# Patient Record
Sex: Female | Born: 1975 | Hispanic: Yes | Smoking: Never smoker
Health system: Southern US, Community
[De-identification: ages and names within clinical notes are randomized; demographics above are authoritative.]

## PROBLEM LIST (undated history)

## (undated) DIAGNOSIS — K529 Noninfective gastroenteritis and colitis, unspecified: Secondary | ICD-10-CM

## (undated) DIAGNOSIS — K649 Unspecified hemorrhoids: Secondary | ICD-10-CM

## (undated) HISTORY — DX: Noninfective gastroenteritis and colitis, unspecified: K52.9

## (undated) HISTORY — PX: COLON SURGERY: SHX602

## (undated) HISTORY — PX: HEMORRHOID SURGERY: SHX153

## (undated) HISTORY — PX: APPENDECTOMY: SHX54

## (undated) HISTORY — DX: Unspecified hemorrhoids: K64.9

## (undated) HISTORY — PX: TOTAL ABDOMINAL HYSTERECTOMY W/ BILATERAL SALPINGOOPHORECTOMY: SHX83

---

## 2017-09-02 ENCOUNTER — Other Ambulatory Visit: Payer: Self-pay

## 2017-09-02 ENCOUNTER — Encounter: Payer: Self-pay | Admitting: Physician Assistant

## 2017-09-02 ENCOUNTER — Ambulatory Visit (INDEPENDENT_AMBULATORY_CARE_PROVIDER_SITE_OTHER): Payer: BLUE CROSS/BLUE SHIELD

## 2017-09-02 ENCOUNTER — Ambulatory Visit: Payer: BLUE CROSS/BLUE SHIELD | Admitting: Physician Assistant

## 2017-09-02 VITALS — BP 118/80 | HR 87 | Temp 99.0°F | Resp 18 | Ht 62.0 in | Wt 178.4 lb

## 2017-09-02 DIAGNOSIS — R109 Unspecified abdominal pain: Secondary | ICD-10-CM | POA: Diagnosis not present

## 2017-09-02 DIAGNOSIS — R1032 Left lower quadrant pain: Secondary | ICD-10-CM | POA: Diagnosis not present

## 2017-09-02 LAB — POCT URINALYSIS DIP (MANUAL ENTRY)
BILIRUBIN UA: NEGATIVE
BILIRUBIN UA: NEGATIVE mg/dL
Glucose, UA: NEGATIVE mg/dL
Leukocytes, UA: NEGATIVE
Nitrite, UA: NEGATIVE
PH UA: 7.5 (ref 5.0–8.0)
PROTEIN UA: NEGATIVE mg/dL
SPEC GRAV UA: 1.02 (ref 1.010–1.025)
Urobilinogen, UA: 0.2 E.U./dL

## 2017-09-02 LAB — POCT CBC
Granulocyte percent: 59 %G (ref 37–80)
HEMATOCRIT: 37.8 % (ref 37.7–47.9)
HEMOGLOBIN: 12.1 g/dL — AB (ref 12.2–16.2)
Lymph, poc: 3.2 (ref 0.6–3.4)
MCH: 24 pg — AB (ref 27–31.2)
MCHC: 32 g/dL (ref 31.8–35.4)
MCV: 75 fL — AB (ref 80–97)
MID (cbc): 0.7 (ref 0–0.9)
MPV: 8.7 fL (ref 0–99.8)
POC GRANULOCYTE: 5.6 (ref 2–6.9)
POC LYMPH PERCENT: 33.4 %L (ref 10–50)
POC MID %: 7.6 %M (ref 0–12)
Platelet Count, POC: 332 10*3/uL (ref 142–424)
RBC: 5.04 M/uL (ref 4.04–5.48)
RDW, POC: 18 %
WBC: 9.5 10*3/uL (ref 4.6–10.2)

## 2017-09-02 LAB — POCT URINE PREGNANCY: Preg Test, Ur: NEGATIVE

## 2017-09-02 LAB — GLUCOSE, POCT (MANUAL RESULT ENTRY): POC Glucose: 83 mg/dl (ref 70–99)

## 2017-09-02 NOTE — Progress Notes (Signed)
Patient ID: Kelsey Gaines, female     DOB: February 20, 1976, 42 y.o.    MRN: [id number]  PCP: Patient, No Pcp Per  Chief Complaint  Patient presents with  . Abdominal Pain    x1 day, per translator pt is having pain on the left side and goes below the belly button. Pt states she isn't having any urinary symptoms.     Subjective:   This patient is new to me and presents for evaluation of LEFT sided abdominal pain. She is accompanied by her boyfriend.  Pain was preceded by several days of constipation, for which she took MoM today and had results, with a decrease in her pain. Increased bowel gas. No melena or hematochezia. No urinary urgency, frequency or burning. No hematuria. No nausea, vomiting. No fever, chills.  History of intestinal infection/inflammation when she was living in the [place] a couple of years ago.   Review of Systems As above.  Prior to Admission medications   Not on File     Allergies  Allergen Reactions  . Advil [Ibuprofen] Hives  . Tylenol [Acetaminophen] Hives     There are no active problems to display for this patient.    Family History  Problem Relation Age of Onset  . Diabetes Mother      Social History   Socioeconomic History  . Marital status: Single    Spouse name: Not on file  . Number of children: 2  . Years of education: Not on file  . Highest education level: Some college, no degree  Social Needs  . Financial resource strain: Not on file  . Food insecurity - worry: Not on file  . Food insecurity - inability: Not on file  . Transportation needs - medical: Not on file  . Transportation needs - non-medical: Not on file  Occupational History  . Occupation: homemaker  Tobacco Use  . Smoking status: Never Smoker  . Smokeless tobacco: Never Used  Substance and Sexual Activity  . Alcohol use: No    Frequency: Never  . Drug use: No  . Sexual activity: Yes    Birth control/protection: None  Other Topics  Concern  . Not on file  Social History Narrative   Originally from the [place].   Left college after 3 semesters and cared for her mother until moving [place] the [place] in 04/2017.   She lives with her boyfriend and her older daughter.   Her younger daughter lives in the [place]         Objective:  Physical Exam  Constitutional: She is oriented to person, place, and time. She appears well-developed and well-nourished. She is active and cooperative. No distress.  BP 118/80 (BP Location: Right Arm, Patient Position: Sitting, Cuff Size: Large)   Pulse 87   Temp 99 F (37.2 C) (Oral)   Resp 18   Ht 5\' 2"  (1.575 m)   Wt 178 lb 6.4 oz (80.9 kg)   LMP 08/26/2017   SpO2 100%   BMI 32.63 kg/m   HENT:  Head: Normocephalic and atraumatic.  Right Ear: Hearing normal.  Left Ear: Hearing normal.  Eyes: Conjunctivae are normal. No scleral icterus.  Neck: Normal range of motion. Neck supple. No thyromegaly present.  Cardiovascular: Normal rate, regular rhythm and normal heart sounds.  Pulses:      Radial pulses are 2+ on the right side, and 2+ on the left side.  Pulmonary/Chest: Effort normal and breath sounds normal.  Abdominal: Soft.  Normal appearance. Bowel sounds are decreased. There is no hepatosplenomegaly. There is tenderness in the periumbilical area and left lower quadrant. There is no rigidity, no rebound, no guarding, no CVA tenderness, no tenderness at McBurney's point and negative Murphy's sign.  Lymphadenopathy:       Head (right side): No tonsillar, no preauricular, no posterior auricular and no occipital adenopathy present.       Head (left side): No tonsillar, no preauricular, no posterior auricular and no occipital adenopathy present.    She has no cervical adenopathy.       Right: No supraclavicular adenopathy present.       Left: No supraclavicular adenopathy present.  Neurological: She is alert and oriented to person, place, and time. No sensory deficit.  Skin: Skin is  warm, dry and intact. No rash noted. No cyanosis or erythema. Nails show no clubbing.  Psychiatric: She has a normal mood and affect. Her speech is normal and behavior is normal.     Results for orders placed or performed in visit on 09/02/17  POCT urinalysis dipstick  Result Value Ref Range   Color, UA yellow yellow   Clarity, UA cloudy (A) clear   Glucose, UA negative negative mg/dL   Bilirubin, UA negative negative   Ketones, POC UA negative negative mg/dL   Spec Grav, UA 1.610 9.604 - 1.025   Blood, UA trace-intact (A) negative   pH, UA 7.5 5.0 - 8.0   Protein Ur, POC negative negative mg/dL   Urobilinogen, UA 0.2 0.2 or 1.0 E.U./dL   Nitrite, UA Negative Negative   Leukocytes, UA Negative Negative  POCT glucose (manual entry)  Result Value Ref Range   POC Glucose 83 70 - 99 mg/dl  POCT urine pregnancy  Result Value Ref Range   Preg Test, Ur Negative Negative  POCT CBC  Result Value Ref Range   WBC 9.5 4.6 - 10.2 K/uL   Lymph, poc 3.2 0.6 - 3.4   POC LYMPH PERCENT 33.4 10 - 50 %L   MID (cbc) 0.7 0 - 0.9   POC MID % 7.6 0 - 12 %M   POC Granulocyte 5.6 2 - 6.9   Granulocyte percent 59.0 37 - 80 %G   RBC 5.04 4.04 - 5.48 M/uL   Hemoglobin 12.1 (A) 12.2 - 16.2 g/dL   HCT, POC 54.0 98.1 - 47.9 %   MCV 75.0 (A) 80 - 97 fL   MCH, POC 24.0 (A) 27 - 31.2 pg   MCHC 32.0 31.8 - 35.4 g/dL   RDW, POC 19.1 %   Platelet Count, POC 332 142 - 424 K/uL   MPV 8.7 0 - 99.8 fL   Dg Abd Acute W/chest  Result Date: 09/02/2017 CLINICAL DATA:  Left abdominal and flank pain. History of appendectomy. EXAM: DG ABDOMEN ACUTE W/ 1V CHEST COMPARISON:  None. FINDINGS: Cardiomediastinal silhouette is unremarkable. The lungs are clear. No pleural effusion, airspace disease or pneumothorax. Gas in scattered nondistended right small bowel loops noted. There is no evidence of bowel obstruction or pneumoperitoneum. A 5 mm calcification overlying the lower right abdomen is noted. The bony structures are  unremarkable. IMPRESSION: 1. 5 mm calcification overlying the lower right abdomen-probably of little clinical significance given history of appendectomy. 2. Nonspecific nonobstructive bowel gas pattern. No evidence of pneumoperitoneum or suspicious calcifications. 3. No evidence of active cardiopulmonary disease. Electronically Signed   By: Harmon Pier M.D.   On: 09/02/2017 15:13       Assessment &  Plan:  1. Left lower quadrant pain Suspect constipation. No systemic symptoms. Normal CBC is reassuring. Supportive care. RTC if worsening or persists. Consider CT scan to evaluate for diverticulitis, nephrolithiasis. - POCT urinalysis dipstick - Urine Culture - Urine Microscopic - POCT glucose (manual entry) - POCT urine pregnancy - GC/Chlamydia Probe Amp - POCT CBC - DG Abd Acute W/Chest; Future    Return if symptoms worsen or fail to improve in the next 3-5 days.   Fernande Bras, PA-C Primary Care at [facility]

## 2017-09-02 NOTE — Progress Notes (Signed)
Subjective:    Patient ID: Kelsey Gaines, female    DOB: 08/10/1975, 42 y.o.   MRN: [id number]  Chief Complaint  Patient presents with  . Abdominal Pain    x1 day, per translator pt is having pain on the left side and goes below the belly button. Pt states she isn't having any urinary symptoms.    Presents today with left-sided abdominal pain which started yesterday night around 6 pm. The pain came on suddenly. She was not eating anything when the pain occurred. She has been constipated for the past 2 days. Took Milk of Magnesium this morning and had a bowel movement. Denies any blood in her stool. Notes increased flatulence and a "warm heat" sensation around her stomach. Denies any symptoms prior to the pain yesterday evening.  Denies: nausea, vomiting, diarrhea, fever, chills, increased urination, burning with urination, or hematuria. She is currently sexually active with her boyfriend who accompanies her today. No current methods to prevent pregnancy or STIs.   Previously in the [place] she had an infection in her intestines. She does not recall what condition she had or what treatment was provided, but notes inflammation in the lining of her intestines. A camera was used to make the diagnosis.  There is a language barrier present. Video translator in the appointment. Little information was elicited from the patient. Responded to most answers with "more or less."  Review of Systems As above  There are no active problems to display for this patient.  Prior to Admission medications   Not on File   Allergies  Allergen Reactions  . Advil [Ibuprofen] Hives  . Diclofenac   . Tylenol [Acetaminophen] Hives      Objective:   Physical Exam  Constitutional: She is oriented to person, place, and time. She appears well-developed and well-nourished. No distress.  HENT:  Head: Normocephalic.  Eyes: No scleral icterus.  Neck: No JVD present. No thyromegaly present.    Cardiovascular: Normal rate, regular rhythm, normal heart sounds and intact distal pulses. Exam reveals no gallop and no friction rub.  No murmur heard. Pulmonary/Chest: Effort normal and breath sounds normal. No respiratory distress. She has no wheezes. She has no rales. She exhibits no tenderness.  Abdominal: Soft. Normal appearance and bowel sounds are normal. She exhibits no shifting dullness, no distension, no fluid wave, no ascites and no mass. There is tenderness in the suprapubic area and left lower quadrant. There is no rigidity, no rebound, no guarding, no CVA tenderness, no tenderness at McBurney's point and negative Murphy's sign. No hernia.  Lymphadenopathy:    She has no cervical adenopathy.  Neurological: She is alert and oriented to person, place, and time.   Results for orders placed or performed in visit on 09/02/17 (from the past 24 hour(s))  POCT urinalysis dipstick     Status: Abnormal   Collection Time: 09/02/17  1:42 PM  Result Value Ref Range   Color, UA yellow yellow   Clarity, UA cloudy (A) clear   Glucose, UA negative negative mg/dL   Bilirubin, UA negative negative   Ketones, POC UA negative negative mg/dL   Spec Grav, UA 8.2951.020 6.2131.010 - 1.025   Blood, UA trace-intact (A) negative   pH, UA 7.5 5.0 - 8.0   Protein Ur, POC negative negative mg/dL   Urobilinogen, UA 0.2 0.2 or 1.0 E.U./dL   Nitrite, UA Negative Negative   Leukocytes, UA Negative Negative  POCT urine pregnancy     Status: None  Collection Time: 09/02/17  2:39 PM  Result Value Ref Range   Preg Test, Ur Negative Negative  POCT glucose (manual entry)     Status: None   Collection Time: 09/02/17  2:58 PM  Result Value Ref Range   POC Glucose 83 70 - 99 mg/dl  POCT CBC     Status: Abnormal   Collection Time: 09/02/17  3:00 PM  Result Value Ref Range   WBC 9.5 4.6 - 10.2 K/uL   Lymph, poc 3.2 0.6 - 3.4   POC LYMPH PERCENT 33.4 10 - 50 %L   MID (cbc) 0.7 0 - 0.9   POC MID % 7.6 0 - 12 %M    POC Granulocyte 5.6 2 - 6.9   Granulocyte percent 59.0 37 - 80 %G   RBC 5.04 4.04 - 5.48 M/uL   Hemoglobin 12.1 (A) 12.2 - 16.2 g/dL   HCT, POC 96.0 45.4 - 47.9 %   MCV 75.0 (A) 80 - 97 fL   MCH, POC 24.0 (A) 27 - 31.2 pg   MCHC 32.0 31.8 - 35.4 g/dL   RDW, POC 09.8 %   Platelet Count, POC 332 142 - 424 K/uL   MPV 8.7 0 - 99.8 fL   Chest X-ray results 09/02/17 IMPRESSION: 1. 5 mm calcification overlying the lower right abdomen-probably of little clinical significance given history of appendectomy. 2. Nonspecific nonobstructive bowel gas pattern. No evidence of pneumoperitoneum or suspicious calcifications. 3. No evidence of active cardiopulmonary disease.    Assessment & Plan:  1. Left lower quadrant pain Unclear etiology. POCT urinalysis, glucose, and CBC within normal limits. CXray shows no signs of bowel obstruction or kidney stones. Likely due to recent constipation, since pain improved after bowel movement this morning. Educated on ways to promote bowel health. Instructed to increase daily intake of water and fiber. Increase physical activity. If needed, use stool softener (Docusate) or an osmotic laxative (Miralax). Await results of urine culture and GC/Chlamydia probe. Will adjust treatment as indicated by results.   - POCT urinalysis dipstick - Urine Culture - Urine Microscopic - POCT glucose (manual entry) - POCT urine pregnancy - GC/Chlamydia Probe Amp - POCT CBC - DG Abd Acute W/Chest; Future  Return if symptoms worsen or fail to improve in the next 3-5 days.  Alfonse Alpers, PA-S

## 2017-09-02 NOTE — Patient Instructions (Addendum)
So far, there is no cause for your symptoms. I suspect that the pain is due to having recent constipation, and will resolve with supportive care.  To help reduce constipation and promote bowel health, 1. Drink at least 64 ounces of water each day; 2. Eat plenty of fiber (fruits, vegetables, whole grains, legumes) 3. Get plenty of physical activity  If needed, use a stool softener (docusate) or an osmotic laxative (like Miralax) each day, or as needed.   IF you received an x-ray today, you will receive an invoice from [facility]. Please contact [facility] at [phone] with questions or concerns regarding your invoice.   IF you received labwork today, you will receive an invoice from [facility]. Please contact [facility] at [phone] with questions or concerns regarding your invoice.   Our billing staff will not be able to assist you with questions regarding bills from these companies.  You will be contacted with the lab results as soon as they are available. The fastest way to get your results is to activate your My Chart account. Instructions are located on the last page of this paperwork. If you have not heard from us regarding the results in 2 weeks, please contact this office.

## 2017-09-03 LAB — URINALYSIS, MICROSCOPIC ONLY: Casts: NONE SEEN /lpf

## 2017-09-03 LAB — URINE CULTURE: ORGANISM ID, BACTERIA: NO GROWTH

## 2017-09-03 LAB — GC/CHLAMYDIA PROBE AMP
CHLAMYDIA, DNA PROBE: NEGATIVE
Neisseria gonorrhoeae by PCR: NEGATIVE

## 2017-09-09 ENCOUNTER — Telehealth: Payer: Self-pay | Admitting: Physician Assistant

## 2017-09-09 NOTE — Telephone Encounter (Signed)
Patient called with interpreter on the line- patient notified of results. She states she is still having the pain. She was offered follow up- she will call back for appointment.

## 2017-09-13 ENCOUNTER — Ambulatory Visit (INDEPENDENT_AMBULATORY_CARE_PROVIDER_SITE_OTHER): Payer: BLUE CROSS/BLUE SHIELD | Admitting: Physician Assistant

## 2017-09-13 ENCOUNTER — Other Ambulatory Visit: Payer: Self-pay

## 2017-09-13 ENCOUNTER — Encounter: Payer: Self-pay | Admitting: Physician Assistant

## 2017-09-13 VITALS — BP 110/78 | HR 67 | Temp 98.9°F | Ht 61.5 in | Wt 180.0 lb

## 2017-09-13 DIAGNOSIS — R1084 Generalized abdominal pain: Secondary | ICD-10-CM

## 2017-09-13 DIAGNOSIS — R109 Unspecified abdominal pain: Secondary | ICD-10-CM | POA: Diagnosis not present

## 2017-09-13 DIAGNOSIS — K5900 Constipation, unspecified: Secondary | ICD-10-CM

## 2017-09-13 LAB — POCT URINALYSIS DIP (MANUAL ENTRY)
Bilirubin, UA: NEGATIVE
Blood, UA: NEGATIVE
Glucose, UA: NEGATIVE mg/dL
Ketones, POC UA: NEGATIVE mg/dL
NITRITE UA: NEGATIVE
PROTEIN UA: NEGATIVE mg/dL
Spec Grav, UA: 1.02 (ref 1.010–1.025)
UROBILINOGEN UA: 0.2 U/dL
pH, UA: 5.5 (ref 5.0–8.0)

## 2017-09-13 MED ORDER — SIMETHICONE 80 MG PO TABS: 1.0000 | ORAL_TABLET | Freq: Four times a day (QID) | ORAL | 0 refills | Status: AC

## 2017-09-13 NOTE — Patient Instructions (Addendum)
Take a probiotic supplement daily.  You can get this from any food store.   I would also like you to try the gas medicine. For the next 5 days, take miralax twice per day.    What foods are not recommended? The following are some foods and drinks that may worsen your symptoms:  Fatty foods, such as [place] fries.  Milk products, such as cheese or ice cream.  Chocolate.  Alcohol.  Products with caffeine, such as coffee.  Carbonated drinks, such as soda.  The items listed above may not be a complete list of foods and beverages to avoid. Contact your dietitian for more information. What foods are good sources of fiber? Your health care provider or dietitian may recommend that you eat more foods that contain fiber. Fiber can help reduce constipation and other IBS symptoms. Add foods with fiber to your diet a little at a time so that your body can get used to them. Too much fiber at once might cause gas and swelling of your abdomen. The following are some foods that are good sources of fiber:  Apples.  Peaches.  Pears.  Berries.  Figs.  Broccoli (raw).  Cabbage.  Carrots.  Raw peas.  Kidney beans.  Lima beans.  Whole grain bread.  Whole grain cereal.  Where to find more information: [employer] for Functional Gastrointestinal Disorders: www.iffgd.[employer] National Institute of Diabetes and Digestive and Kidney Diseases: http://norris-lawson.com/www.niddk.nih.gov/health-information/health-topics/digestive-diseases/ibs/Pages/facts.aspx This information is not intended to replace advice given to you by your health care provider. Make sure you discuss any questions you have with your health care provider. Document Released: 10/05/2003 Document Revised: 12/21/2015 Document Reviewed: 10/15/2013 Elsevier Interactive Patient Education  2018 [employer]    IF you received an x-ray today, you will receive an invoice from [facility]. Please contact [facility] at  [phone] with questions or concerns regarding your invoice.   IF you received labwork today, you will receive an invoice from [facility]. Please contact [facility] at [phone] with questions or concerns regarding your invoice.   Our billing staff will not be able to assist you with questions regarding bills from these companies.  You will be contacted with the lab results as soon as they are available. The fastest way to get your results is to activate your My Chart account. Instructions are located on the last page of this paperwork. If you have not heard from us regarding the results in 2 weeks, please contact this office.

## 2017-09-13 NOTE — Progress Notes (Signed)
PRIMARY CARE AT [facility] [address], [place] [address] [phone]  Date:  09/13/2017   Name:  Kelsey Gaines   DOB:  02-02-1976   MRN:  [id number]  PCP:  Patient, No Pcp Per    History of Present Illness:  Kelsey Gaines is a 42 y.o. female patient who presents to PCP with  Chief Complaint  Patient presents with  . pain abdomen    Left sided pain at waistline - constant, pt increased water, increased fiber, walking  . Nausea    today  . Headache    yesterday   . Depression    42 yo dtr still in [place]- w/grandparents     The pain will get better now, but the pain comes and goes.  This is painful when she pushes on the left side of her abdomen.  This can occur when she is not pressing on it.  She does have less pain after she has a bowel movement.  She feels gassy.   She has been eating fruits vegetables, and broccoli.    She has had this pain before.  This was dxd as constipation prior.  Her bowel movements are not related to stressors that she has noted.   She has had stressors.  When asked about her stressors, she and daughter explain that her daughter is at her country.  She is laughing.      There are no active problems to display for this patient.   History reviewed. No pertinent past medical history.  Past Surgical History:  Procedure Laterality Date  . APPENDECTOMY    . CESAREAN SECTION    . COLON SURGERY      Social History   Tobacco Use  . Smoking status: Never Smoker  . Smokeless tobacco: Never Used  Substance Use Topics  . Alcohol use: No    Frequency: Never  . Drug use: No    Family History  Problem Relation Age of Onset  . Diabetes Mother     Allergies  Allergen Reactions  . Ciprofloxacin Hives  . Advil [Ibuprofen] Hives  . Diclofenac   . Tylenol [Acetaminophen] Hives    Medication list has been reviewed and updated.  No current outpatient medications on file prior to visit.   No current facility-administered medications on file  prior to visit.     ROS ROS otherwise unremarkable unless listed above.  Physical Examination: BP 110/78 (BP Location: Right Arm, Patient Position: Sitting, Cuff Size: Large)   Pulse 67   Temp 98.9 F (37.2 C) (Oral)   Ht 5' 1.5" (1.562 m)   Wt 180 lb (81.6 kg)   LMP 08/26/2017   SpO2 99%   BMI 33.46 kg/m  Ideal Body Weight: Weight in (lb) to have BMI = 25: 134.2  Physical Exam  Constitutional: She is oriented to person, place, and time. She appears well-developed and well-nourished. No distress.  HENT:  Head: Normocephalic and atraumatic.  Right Ear: External ear normal.  Left Ear: External ear normal.  Eyes: Conjunctivae and EOM are normal. Pupils are equal, round, and reactive to light.  Cardiovascular: Normal rate.  Pulmonary/Chest: Effort normal. No respiratory distress.  Abdominal: Soft. Normal appearance and bowel sounds are normal. There is no hepatosplenomegaly. There is no tenderness (minimally tenderness generalized but nothing specific or consistent.).  Neurological: She is alert and oriented to person, place, and time.  Skin: She is not diaphoretic.  Psychiatric: She has a normal mood and affect. Her behavior is normal.  Assessment and Plan: Kelsey Gaines is a 42 y.o. female who is here today for cc of  Chief Complaint  Patient presents with  . pain abdomen    Left sided pain at waistline - constant, pt increased water, increased fiber, walking  . Nausea    today  . Headache    yesterday   . Depression    42 yo dtr still in [place]- w/grandparents  declines treatment at this time for depression, and this is very situational.   Given simethicone for gas symptoms.   Discussed diary monitoring.   Obtaining CT, however patient states that they may continue to try treatment, and attempt to resolve at home.  Concerned of cost.   Possible IBS  Left sided abdominal pain - Plan: POCT urinalysis dipstick, CMP14+EGFR, TSH, CBC, Simethicone 80 MG TABS, CANCELED:  Celiac panel 10  Right sided abdominal pain - Plan: CBC, Simethicone 80 MG TABS, CANCELED: Celiac panel 10  Constipation, unspecified constipation type - Plan: POCT urinalysis dipstick, CMP14+EGFR, TSH, CBC, Simethicone 80 MG TABS, CANCELED: Celiac panel 10  Generalized abdominal pain - Plan: CT ABDOMEN PELVIS W CONTRAST, CBC, Simethicone 80 MG TABS  Ivar Drape, PA-C [facility] 2/26/20192:04 PM

## 2017-09-14 LAB — CBC
HEMATOCRIT: 40 % (ref 34.0–46.6)
Hemoglobin: 12.1 g/dL (ref 11.1–15.9)
MCH: 23.7 pg — ABNORMAL LOW (ref 26.6–33.0)
MCHC: 30.3 g/dL — AB (ref 31.5–35.7)
MCV: 78 fL — AB (ref 79–97)
Platelets: 319 10*3/uL (ref 150–379)
RBC: 5.1 x10E6/uL (ref 3.77–5.28)
RDW: 17.4 % — ABNORMAL HIGH (ref 12.3–15.4)
WBC: 10.5 10*3/uL (ref 3.4–10.8)

## 2017-09-14 LAB — CMP14+EGFR
ALT: 17 [IU]/L (ref 0–32)
AST: 18 [IU]/L (ref 0–40)
Albumin/Globulin Ratio: 1.4 (ref 1.2–2.2)
Albumin: 4.1 g/dL (ref 3.5–5.5)
Alkaline Phosphatase: 88 [IU]/L (ref 39–117)
BUN/Creatinine Ratio: 15 (ref 9–23)
BUN: 12 mg/dL (ref 6–24)
Bilirubin Total: <0.2 mg/dL (ref 0.0–1.2)
CO2: 18 mmol/L — ABNORMAL LOW (ref 20–29)
Calcium: 9.8 mg/dL (ref 8.7–10.2)
Chloride: 104 mmol/L (ref 96–106)
Creatinine, Ser: 0.78 mg/dL (ref 0.57–1.00)
GFR calc Af Amer: 108 mL/min/{1.73_m2}
GFR calc non Af Amer: 93 mL/min/{1.73_m2}
Globulin, Total: 2.9 g/dL (ref 1.5–4.5)
Glucose: 78 mg/dL (ref 65–99)
Potassium: 4.7 mmol/L (ref 3.5–5.2)
Sodium: 137 mmol/L (ref 134–144)
Total Protein: 7 g/dL (ref 6.0–8.5)

## 2017-09-14 LAB — TSH: TSH: 1.38 u[IU]/mL (ref 0.450–4.500)

## 2017-09-23 ENCOUNTER — Telehealth: Payer: Self-pay | Admitting: Physician Assistant

## 2017-09-23 NOTE — Telephone Encounter (Signed)
Tried to get prior auth for pt to have ct done but it was denied through pt insurance. A peer to peer can be done by calling [employer] at [phone] pt bcbs id# [id number] and dob Jul 26, 1976

## 2017-09-23 NOTE — Telephone Encounter (Signed)
Patient was very reluctant to have this performed.  Can you contact patient, and inquire if she would like to proceed or hold off with ct

## 2017-09-24 NOTE — Telephone Encounter (Signed)
Talked to pt husband and daughter to translate and pt would still like the ct done. The peer to peer still can be done to see if it can get approved through insurance

## 2017-10-28 ENCOUNTER — Encounter: Payer: Self-pay | Admitting: Physician Assistant

## 2018-08-20 ENCOUNTER — Other Ambulatory Visit: Payer: Self-pay

## 2018-08-20 ENCOUNTER — Emergency Department (HOSPITAL_COMMUNITY): Payer: Self-pay

## 2018-08-20 ENCOUNTER — Emergency Department (HOSPITAL_COMMUNITY)
Admission: EM | Admit: 2018-08-20 | Discharge: 2018-08-20 | Disposition: A | Discharge status: Home / Self Care | Payer: Self-pay | Attending: Emergency Medicine | Admitting: Emergency Medicine

## 2018-08-20 ENCOUNTER — Encounter (HOSPITAL_COMMUNITY): Payer: Self-pay

## 2018-08-20 DIAGNOSIS — R1032 Left lower quadrant pain: Secondary | ICD-10-CM | POA: Insufficient documentation

## 2018-08-20 DIAGNOSIS — Z79899 Other long term (current) drug therapy: Secondary | ICD-10-CM | POA: Insufficient documentation

## 2018-08-20 DIAGNOSIS — N83202 Unspecified ovarian cyst, left side: Secondary | ICD-10-CM | POA: Insufficient documentation

## 2018-08-20 DIAGNOSIS — R1030 Lower abdominal pain, unspecified: Secondary | ICD-10-CM

## 2018-08-20 LAB — CBC WITH DIFFERENTIAL/PLATELET
Abs Immature Granulocytes: 0.05 10*3/uL (ref 0.00–0.07)
Basophils Absolute: 0.1 10*3/uL (ref 0.0–0.1)
Basophils Relative: 1 %
Eosinophils Absolute: 0.4 10*3/uL (ref 0.0–0.5)
Eosinophils Relative: 3 %
HCT: 39.2 % (ref 36.0–46.0)
Hemoglobin: 11.5 g/dL — ABNORMAL LOW (ref 12.0–15.0)
Immature Granulocytes: 0 %
Lymphocytes Relative: 24 %
Lymphs Abs: 3.1 10*3/uL (ref 0.7–4.0)
MCH: 21.9 pg — ABNORMAL LOW (ref 26.0–34.0)
MCHC: 29.3 g/dL — ABNORMAL LOW (ref 30.0–36.0)
MCV: 74.8 fL — ABNORMAL LOW (ref 80.0–100.0)
Monocytes Absolute: 0.8 10*3/uL (ref 0.1–1.0)
Monocytes Relative: 6 %
Neutro Abs: 8.6 10*3/uL — ABNORMAL HIGH (ref 1.7–7.7)
Neutrophils Relative %: 66 %
Platelets: 374 10*3/uL (ref 150–400)
RBC: 5.24 MIL/uL — ABNORMAL HIGH (ref 3.87–5.11)
RDW: 16.9 % — ABNORMAL HIGH (ref 11.5–15.5)
WBC: 12.9 10*3/uL — ABNORMAL HIGH (ref 4.0–10.5)
nRBC: 0 % (ref 0.0–0.2)

## 2018-08-20 LAB — URINALYSIS, ROUTINE W REFLEX MICROSCOPIC
Bilirubin Urine: NEGATIVE
Glucose, UA: NEGATIVE mg/dL
Hgb urine dipstick: NEGATIVE
Ketones, ur: NEGATIVE mg/dL
Leukocytes, UA: NEGATIVE
Nitrite: NEGATIVE
Protein, ur: NEGATIVE mg/dL
Specific Gravity, Urine: 1.01 (ref 1.005–1.030)
pH: 6 (ref 5.0–8.0)

## 2018-08-20 LAB — LIPASE, BLOOD: Lipase: 26 U/L (ref 11–51)

## 2018-08-20 LAB — COMPREHENSIVE METABOLIC PANEL
ALT: 18 U/L (ref 0–44)
AST: 25 U/L (ref 15–41)
Albumin: 3.9 g/dL (ref 3.5–5.0)
Alkaline Phosphatase: 81 U/L (ref 38–126)
Anion gap: 9 (ref 5–15)
BUN: 10 mg/dL (ref 6–20)
CO2: 20 mmol/L — ABNORMAL LOW (ref 22–32)
Calcium: 9.1 mg/dL (ref 8.9–10.3)
Chloride: 105 mmol/L (ref 98–111)
Creatinine, Ser: 0.79 mg/dL (ref 0.44–1.00)
GFR calc Af Amer: >60 mL/min (ref >60)
GFR calc non Af Amer: >60 mL/min (ref >60)
Glucose, Bld: 90 mg/dL (ref 70–99)
Potassium: 4.4 mmol/L (ref 3.5–5.1)
Sodium: 134 mmol/L — ABNORMAL LOW (ref 135–145)
Total Bilirubin: 0.6 mg/dL (ref 0.3–1.2)
Total Protein: 7.3 g/dL (ref 6.5–8.1)

## 2018-08-20 LAB — POC URINE PREG, ED: Preg Test, Ur: NEGATIVE

## 2018-08-20 MED ORDER — IOPAMIDOL (ISOVUE-300) INJECTION 61%
100.0000 mL | Freq: Once | INTRAVENOUS | Status: AC | PRN
  Administered 2018-08-20: 100 mL via INTRAVENOUS

## 2018-08-20 NOTE — ED Triage Notes (Signed)
Patient arrives to ED with complaints of lower abdominal pain since last night; this problem has been on and off but is worse at night. Patient has been taking aleve, tylenol, and ibuprofen at home for pain relief, pt denies vomiting or fever, denies vaginal discharge or bleeding, or urinary symptoms. Translator utilized for this triage; patient is primarily spanish speaking.

## 2018-08-20 NOTE — ED Notes (Signed)
Per lab, blood samples clotted. Will reorder.  IV team was unsuccessful initiating an IV; second IV team RN will attempt.

## 2018-08-20 NOTE — Discharge Instructions (Addendum)
Please read attached information. If you experience any new or worsening signs or symptoms please return to the emergency room for evaluation. Please follow-up with your primary care provider or specialist as discussed.  °

## 2018-08-20 NOTE — ED Notes (Signed)
IV TEAM at bedside 

## 2018-08-20 NOTE — ED Provider Notes (Addendum)
[facility] Note   CSN: [id number] Arrival date & time: 08/20/18  0840   History   Chief Complaint Chief Complaint  Patient presents with  . Abdominal Pain    HPI Kelsey Gaines is a 43 y.o. female.  HPI   Spanish translator used  43 year old female presents today with complaints of left lower quadrant abdominal pain.  Patient notes 4 days ago she developed a crampy sensation in her left lower quadrant, she notes this is worse at night but persistent throughout the day.  She denies any associated nausea vomiting or fever.  She denies any vaginal bleeding or discharge.  No urinary symptoms.  She notes a bowel movement last night that was "gassy".  She notes a history of appendicitis in the past, no known history of diverticulitis.  She is taking ibuprofen at home with some improvement in her symptoms.  She notes the cramping does not feel similar to her previous menstrual cycles.  LMP January 14.   History reviewed. No pertinent past medical history.  There are no active problems to display for this patient.   Past Surgical History:  Procedure Laterality Date  . APPENDECTOMY    . CESAREAN SECTION    . COLON SURGERY       OB History   No obstetric history on file.      Home Medications    Prior to Admission medications   Medication Sig Start Date End Date Taking? Authorizing Provider  Simethicone 80 MG TABS Take 1 tablet (80 mg total) by mouth 4 (four) times daily. 09/13/17   Garnetta BuddyEnglish, Stephanie D, PA    Family History Family History  Problem Relation Age of Onset  . Diabetes Mother     Social History Social History   Tobacco Use  . Smoking status: Never Smoker  . Smokeless tobacco: Never Used  Substance Use Topics  . Alcohol use: No    Frequency: Never  . Drug use: No     Allergies   Ciprofloxacin; Advil [ibuprofen]; Diclofenac; and Tylenol [acetaminophen]   Review of Systems Review of Systems  All  other systems reviewed and are negative.   Physical Exam Updated Vital Signs BP (!) 141/101 (BP Location: Right Arm)   Pulse 90   Temp 98.4 F (36.9 C) (Oral)   Resp 20   Ht 5' (1.524 m)   Wt 78.5 kg   SpO2 99%   BMI 33.79 kg/m   Physical Exam Vitals signs and nursing note reviewed.  Constitutional:      Appearance: She is well-developed.  HENT:     Head: Normocephalic and atraumatic.  Eyes:     General: No scleral icterus.       Right eye: No discharge.        Left eye: No discharge.     Conjunctiva/sclera: Conjunctivae normal.     Pupils: Pupils are equal, round, and reactive to light.  Neck:     Musculoskeletal: Normal range of motion.     Vascular: No JVD.     Trachea: No tracheal deviation.  Pulmonary:     Effort: Pulmonary effort is normal.     Breath sounds: No stridor.  Abdominal:     General: There is no distension.     Tenderness: There is abdominal tenderness.     Comments: Tenderness palpation of left lower quadrant remainder abdomen pelvis nontender to palpation  Neurological:     Mental Status: She is alert and oriented to  person, place, and time.     Coordination: Coordination normal.  Psychiatric:        Behavior: Behavior normal.        Thought Content: Thought content normal.        Judgment: Judgment normal.      ED Treatments / Results  Labs (all labs ordered are listed, but only abnormal results are displayed) Labs Reviewed  COMPREHENSIVE METABOLIC PANEL - Abnormal; Notable for the following components:      Result Value   Sodium 134 (*)    CO2 20 (*)    All other components within normal limits  URINALYSIS, ROUTINE W REFLEX MICROSCOPIC - Abnormal; Notable for the following components:   Color, Urine STRAW (*)    All other components within normal limits  CBC WITH DIFFERENTIAL/PLATELET - Abnormal; Notable for the following components:   WBC 12.9 (*)    RBC 5.24 (*)    Hemoglobin 11.5 (*)    MCV 74.8 (*)    MCH 21.9 (*)    MCHC  29.3 (*)    RDW 16.9 (*)    Neutro Abs 8.6 (*)    All other components within normal limits  LIPASE, BLOOD  CBC WITH DIFFERENTIAL/PLATELET  POC URINE PREG, ED    EKG None  Radiology Ct Abdomen Pelvis W Contrast  Result Date: 08/20/2018 CLINICAL DATA:  Lower abdominal pain since last night EXAM: CT ABDOMEN AND PELVIS WITH CONTRAST TECHNIQUE: Multidetector CT imaging of the abdomen and pelvis was performed using the standard protocol following bolus administration of intravenous contrast. Sagittal and coronal MPR images reconstructed from axial data set. CONTRAST:  ISOVUE-300 IOPAMIDOL (ISOVUE-300) INJECTION 61% IV. No oral contrast. COMPARISON:  None FINDINGS: Lower chest: Lung bases clear Hepatobiliary: Diffuse fatty infiltration of liver. Gallbladder and liver otherwise normal appearance without evidence of a patent mass or biliary dilatation. Pancreas: Normal appearance Spleen: Normal appearance Adrenals/Urinary Tract: Adrenal glands, kidneys, ureters, and bladder normal appearance Stomach/Bowel: Appendix surgically absent by history. Stomach and bowel loops normal appearance for exam lacking GI contrast. Vascular/Lymphatic: Vascular structures patent. Aorta normal caliber. No adenopathy. Reproductive: Uterus is enlarged with 2 masses likely representing leiomyomata, 2.3 x 1.8 cm anteriorly image 62 and 4.1 x 3.9 cm at upper uterine segment, submucosal. Small collapsed cyst within RIGHT ovary. LEFT ovarian cyst 2.4 x 2.1 cm. Other: Minimal free pelvic fluid. No free air. No hernia or definite inflammatory process. Musculoskeletal: Unremarkable IMPRESSION: Fatty infiltration of liver. 2 uterine leiomyomata largest 4.1 cm greatest size submucosal at upper uterine segment. Small LEFT ovarian cyst 2.4 x 2.1 cm with a collapsed cyst in the RIGHT ovary. Electronically Signed   By: Ulyses Southward M.D.   On: 08/20/2018 11:58    Procedures Procedures (including critical care time)  Medications  Ordered in ED Medications  iopamidol (ISOVUE-300) 61 % injection 100 mL (100 mLs Intravenous Contrast Given 08/20/18 1148)     Initial Impression / Assessment and Plan / ED Course  I have reviewed the triage vital signs and the nursing notes.  Pertinent labs & imaging results that were available during my care of the patient were reviewed by me and considered in my medical decision making (see chart for details).      Assessment/Plan: 43 year old female presents today with complaints of abdominal pain.  She has no infectious signs or symptoms.  She has slight elevation white count.  Reassuring CT with no acute abnormalities.  Patient is eating food in the exam room in  no acute distress.  She does have an ovarian cyst.  I have very low suspicion for ovarian torsion or any other acute interabdominal or pelvic pathology.  Patient given strict return precautions, she verbalized understanding and agreement to today's plan had no further questions or concerns.   Final Clinical Impressions(s) / ED Diagnoses   Final diagnoses:  Lower abdominal pain  Cyst of left ovary    ED Discharge Orders    None          Eyvonne MechanicHedges, Lacie Landry, PA-C 08/20/18 1250    Tegeler, Canary Brimhristopher J, MD 08/20/18 [phone]

## 2019-10-14 IMAGING — CT CT ABD-PELV W/ CM
2 of 5 series · 15 of 46 positions shown, 17 images · IV contrast (Omni 300)
Comparison: None

CLINICAL DATA: Lower abdominal pain since last night

EXAM:
CT ABDOMEN AND PELVIS WITH CONTRAST
TECHNIQUE: Multidetector CT imaging of the abdomen and pelvis was performed
using the standard protocol following bolus administration of
intravenous contrast. Sagittal and coronal MPR images reconstructed
from axial data set.
CONTRAST:  100mL ZP7IPT-B77 IOPAMIDOL (ZP7IPT-B77) INJECTION 61% IV.
No oral contrast.

[Series 3: a/p w/ 5mm · axial · 0.85mm/px · z∈[+722,+1142]mm · 12 of 96 slices shown, 14 images]
[im 6/96  soft-tissue]
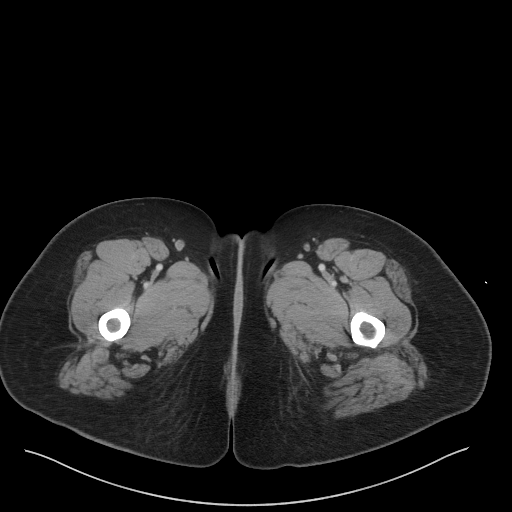
[im 6/96  bone]
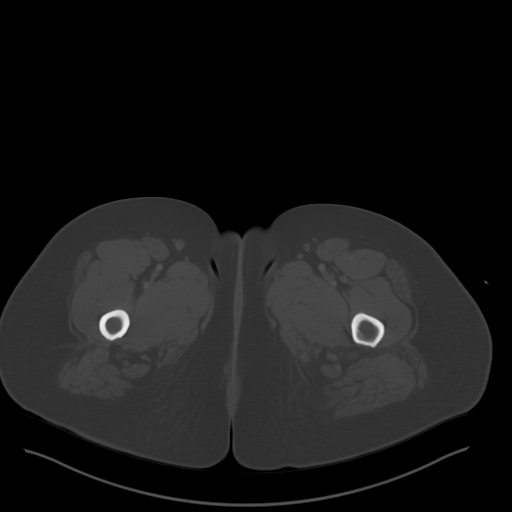
[im 16/96  soft-tissue]
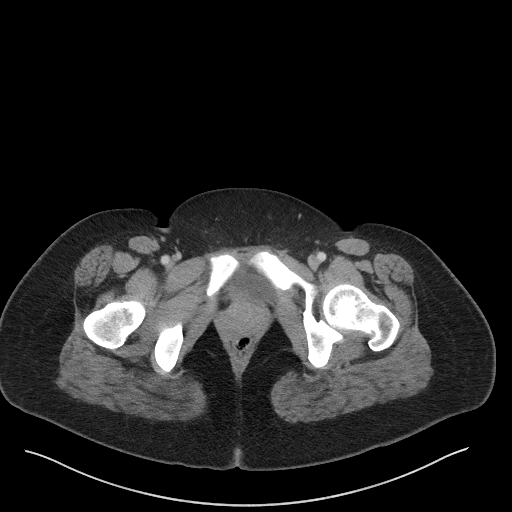
[im 22/96  soft-tissue]
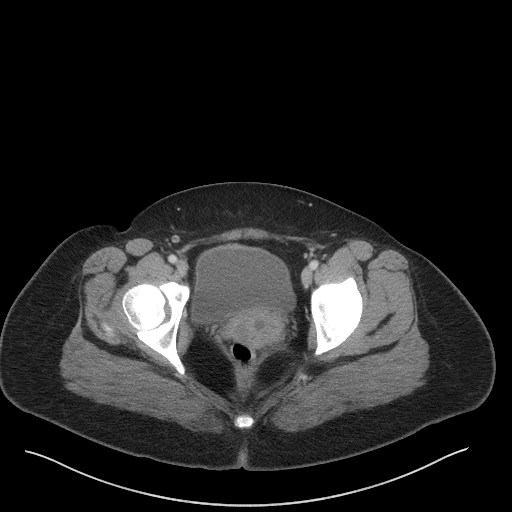
[im 27/96  soft-tissue]
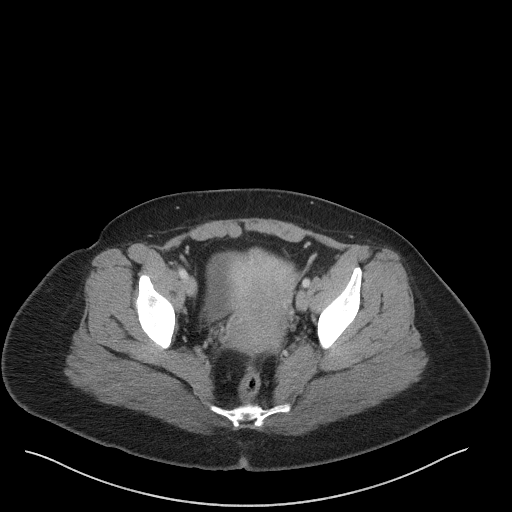
[im 37/96  soft-tissue]
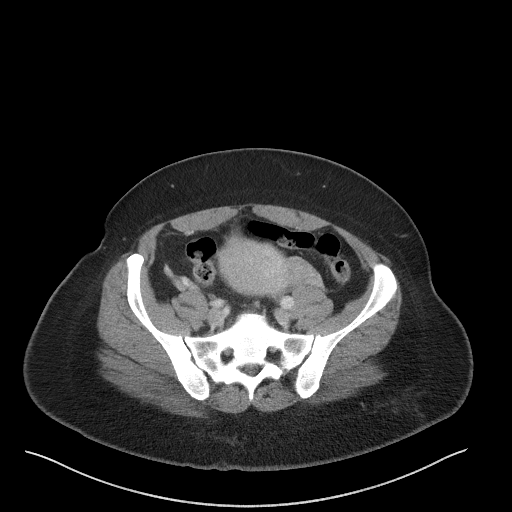
[im 43/96  soft-tissue]
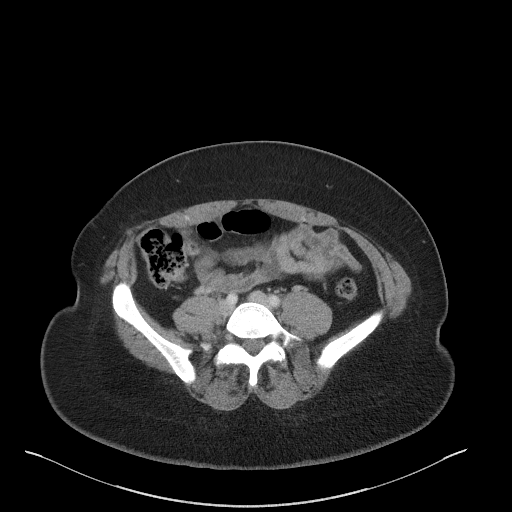
[im 53/96  soft-tissue]
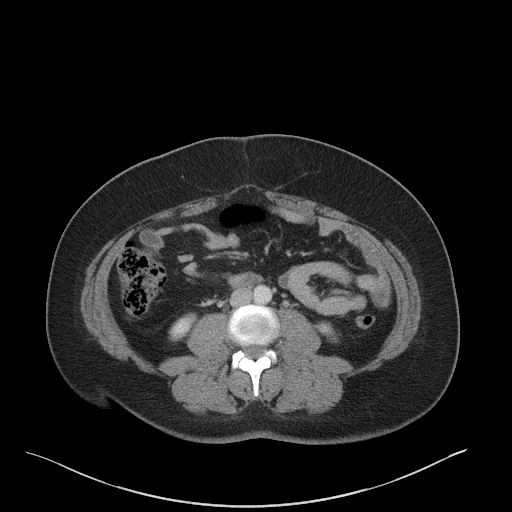
[im 59/96  soft-tissue]
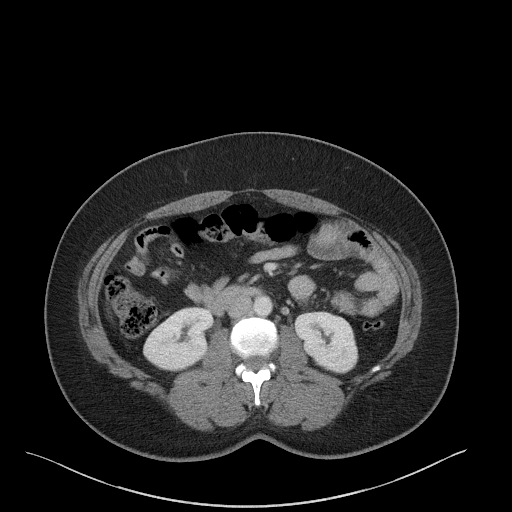
[im 69/96  soft-tissue]
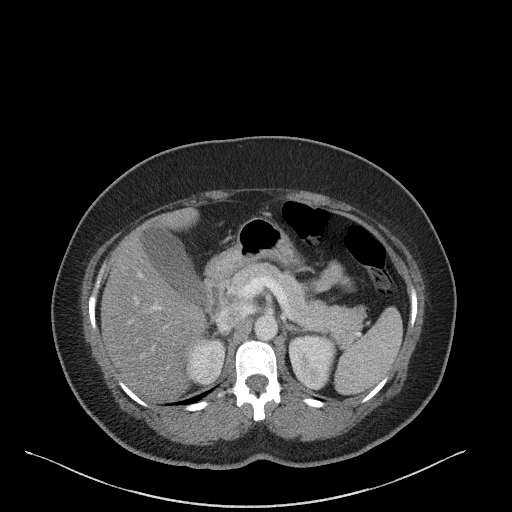
[im 69/96  bone]
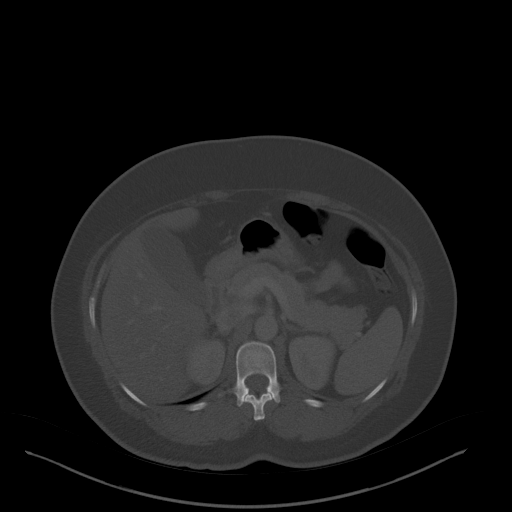
[im 74/96  soft-tissue]
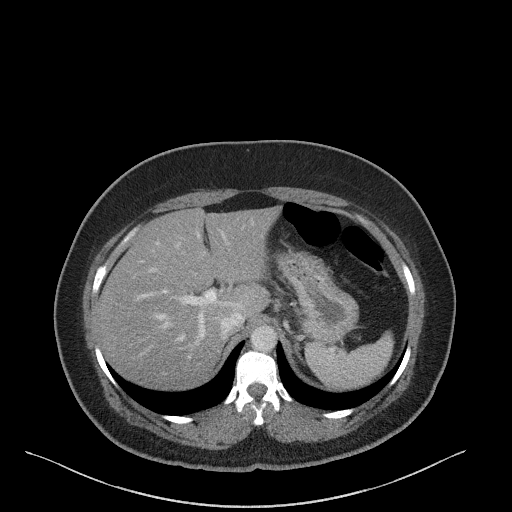
[im 80/96  soft-tissue]
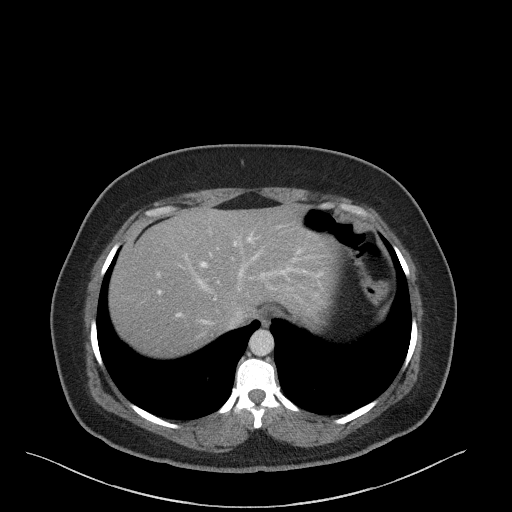
[im 90/96  soft-tissue]
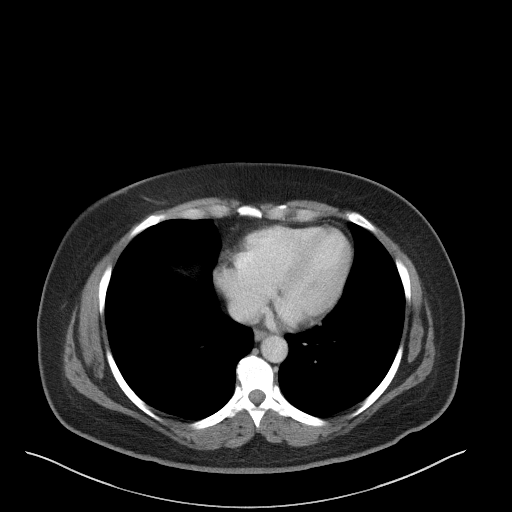

[Series 6: a/p w/ cor · coronal · 0.93mm/px · 3 of 145 slices shown]
[im 49/145  soft-tissue]
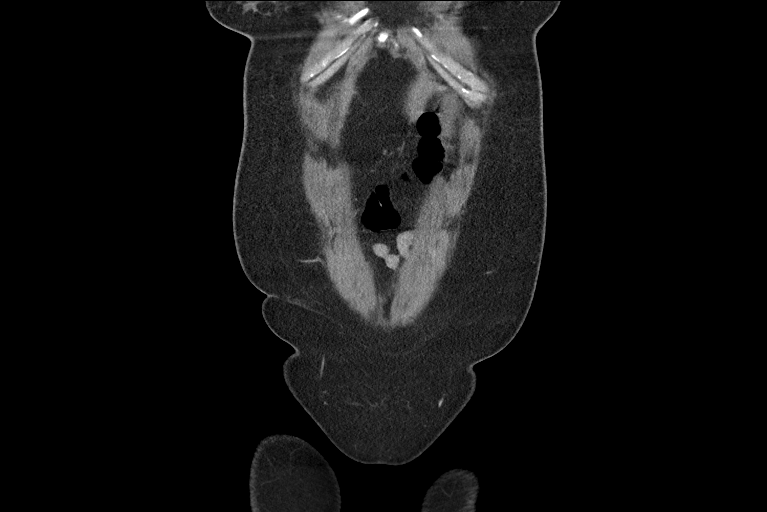
[im 65/145  soft-tissue]
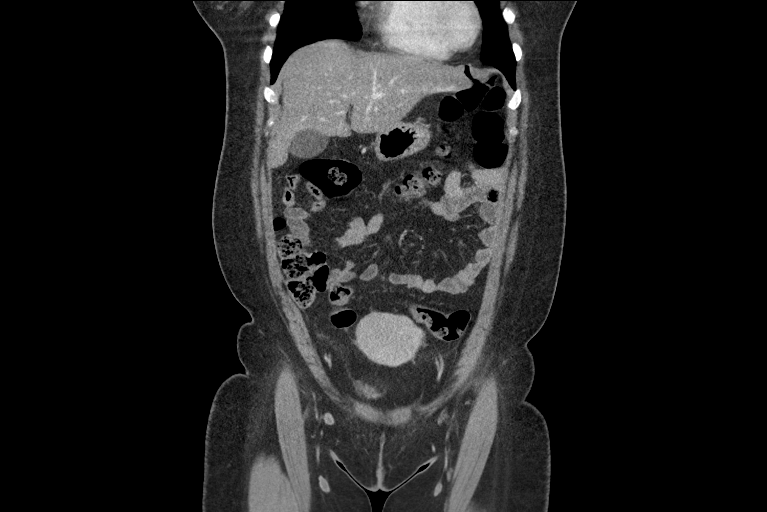
[im 81/145  soft-tissue]
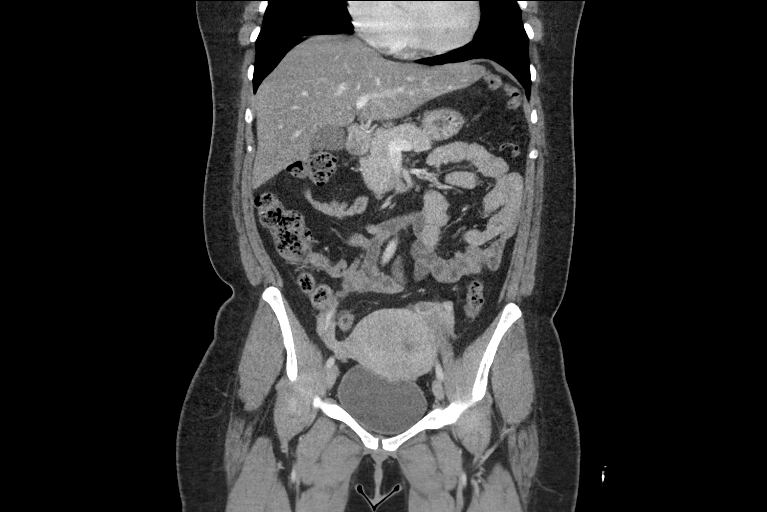

[15 of 46 positions shown; findings below may reference images not displayed]

FINDINGS: Lower chest: Lung bases clear

Hepatobiliary: Diffuse fatty infiltration of liver. Gallbladder and
liver otherwise normal appearance without evidence of a patent mass
or biliary dilatation.

Pancreas: Normal appearance

Spleen: Normal appearance

Adrenals/Urinary Tract: Adrenal glands, kidneys, ureters, and
bladder normal appearance

Stomach/Bowel: Appendix surgically absent by history. Stomach and
bowel loops normal appearance for exam lacking GI contrast.

Vascular/Lymphatic: Vascular structures patent. Aorta normal
caliber. No adenopathy.

Reproductive: Uterus is enlarged with 2 masses likely representing
leiomyomata, 2.3 x 1.8 cm anteriorly image 62 and 4.1 x 3.9 cm at
upper uterine segment, submucosal. Small collapsed cyst within RIGHT
ovary. LEFT ovarian cyst 2.4 x 2.1 cm.

Other: Minimal free pelvic fluid. No free air. No hernia or definite
inflammatory process.

Musculoskeletal: Unremarkable
IMPRESSION: Fatty infiltration of liver.

2 uterine leiomyomata largest 4.1 cm greatest size submucosal at
upper uterine segment.

Small LEFT ovarian cyst 2.4 x 2.1 cm with a collapsed cyst in the
RIGHT ovary.

## 2022-07-01 ENCOUNTER — Other Ambulatory Visit: Payer: Self-pay | Admitting: Nurse Practitioner

## 2022-07-01 ENCOUNTER — Ambulatory Visit
Admission: RE | Admit: 2022-07-01 | Discharge: 2022-07-01 | Disposition: A | Discharge status: Home / Self Care | Payer: No Typology Code available for payment source | Source: Ambulatory Visit | Attending: Nurse Practitioner | Admitting: Nurse Practitioner

## 2022-07-29 DIAGNOSIS — I10 Essential (primary) hypertension: Secondary | ICD-10-CM

## 2022-07-29 HISTORY — DX: Essential (primary) hypertension: I10

## 2022-08-29 DIAGNOSIS — R7303 Prediabetes: Secondary | ICD-10-CM

## 2022-08-29 HISTORY — DX: Prediabetes: R73.03

## 2022-09-19 ENCOUNTER — Encounter: Payer: Self-pay | Admitting: Internal Medicine

## 2022-09-19 ENCOUNTER — Ambulatory Visit (INDEPENDENT_AMBULATORY_CARE_PROVIDER_SITE_OTHER): Payer: BLUE CROSS/BLUE SHIELD | Admitting: Internal Medicine

## 2022-09-19 VITALS — BP 144/86 | HR 68 | Resp 16 | Ht 61.5 in | Wt 214.0 lb

## 2022-09-19 DIAGNOSIS — K649 Unspecified hemorrhoids: Secondary | ICD-10-CM | POA: Diagnosis not present

## 2022-09-19 DIAGNOSIS — K529 Noninfective gastroenteritis and colitis, unspecified: Secondary | ICD-10-CM

## 2022-09-19 DIAGNOSIS — G4730 Sleep apnea, unspecified: Secondary | ICD-10-CM | POA: Diagnosis not present

## 2022-09-19 DIAGNOSIS — Z6839 Body mass index (BMI) 39.0-39.9, adult: Secondary | ICD-10-CM

## 2022-09-19 DIAGNOSIS — Z23 Encounter for immunization: Secondary | ICD-10-CM

## 2022-09-19 DIAGNOSIS — G43909 Migraine, unspecified, not intractable, without status migrainosus: Secondary | ICD-10-CM

## 2022-09-19 DIAGNOSIS — E6609 Other obesity due to excess calories: Secondary | ICD-10-CM

## 2022-09-19 HISTORY — DX: Sleep apnea, unspecified: G47.30

## 2022-09-19 MED ORDER — SUMATRIPTAN SUCCINATE 100 MG PO TABS: 100.0000 mg | ORAL_TABLET | ORAL | 6 refills | Status: AC | PRN

## 2022-09-19 NOTE — Patient Instructions (Signed)

## 2022-09-19 NOTE — Progress Notes (Signed)
Subjective:    Patient ID: Kelsey Gaines, female   DOB: [birth year], 47 y.o.   MRN: [id number]   HPI  Here to establish  [name] interprets   Headaches:  states was diagnosed with migraines when living in [place].  States started having these headaches about 6 years ago.  Describes having what sounds like a CT Scan and EEG.  Was told everything was normal.  Was given Rx for Treximet or combo of Sumatriptan -Naproxen.  The medicine helps, but not as efficiently as previously Having headaches now every 3 weeks.  HA can last 24 hours or less, depending on whether she takes the Sumatriptan or not.  She is not taking the medication often until the headache is already severe.  She can tell if the headache will be a migraine.  She also does not repeat the medication in 2 hours if pain not relieved.   Typical migraine headache described as follows:  Almost always left sided.  No definite aura.  Feels pinching along left side of temporal/parietal scalp.  Cannot say how long it takes for her to develop Cannot characterize the type of pain--states it is severe and heavy, but not pounding.  + Photophobia and sometimes phonophobia as well as nausea.  Can vomit with the nausea. Her father also had history of migraines.   Feeling hot/being in a hot environment is a trigger.   Not related to menstrual cycle when she was premenopausal.    No side effects with the medication.  2.  Elevated BP:  Has only been told this once before when ED in January with abdominal pain  3.  Snoring:  describes possible apneic episodes with frequent startled awakenings.  Not clear if daytime somnolence.     Current Meds  Medication Sig   SUMAtriptan-naproxen (TREXIMET) 85-500 MG tablet Take 1 tablet by mouth every 2 (two) hours as needed for migraine.   Allergies  Allergen Reactions   Ciprofloxacin Hives   Diclofenac    Tylenol [Acetaminophen] Hives   Past Medical History:  Diagnosis Date    Colitis    Not sure if this is correct or just problem with hemorrhoids   Hemorrhoids    Past Surgical History:  Procedure Laterality Date   APPENDECTOMY     CESAREAN SECTION     HEMORRHOID SURGERY     2 surgeries in past   TOTAL ABDOMINAL HYSTERECTOMY W/ BILATERAL SALPINGOOPHORECTOMY     Family History  Problem Relation Age of Onset   Hypertension Mother    Diabetes Mother    Migraines Father    Cirrhosis Brother        cause of death   Alcohol abuse Brother    Social History   Socioeconomic History   Marital status: Media planner    Spouse name: [name]   Number of children: 2   Years of education: Not on file   Highest education level: Some college, no degree  Occupational History   Occupation: homemaker  Tobacco Use   Smoking status: Never    Passive exposure: Never   Smokeless tobacco: Never  Vaping Use   Vaping Use: Never used  Substance and Sexual Activity   Alcohol use: No   Drug use: No   Sexual activity: Yes    Birth control/protection: None, Post-menopausal  Other Topics Concern   Not on file  Social History Narrative   Originally from the [place].   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   She lives with her boyfriend and her older daughter.   Her younger daughter joined them 08/2021   Social Determinants of Health   Financial Resource Strain: Not on file  Food Insecurity: Not on file  Transportation Needs: Not on file  Physical Activity: Not on file  Stress: Not on file  Social Connections: Not on file  Intimate Partner Violence: Not on file     Review of Systems    Objective:   BP (!) 144/104 (BP Location: Right Arm, Patient Position: Sitting, Cuff Size: Normal)   Pulse 68   Resp 16   Ht 5' 1.5" (1.562 m)   Wt 214 lb (97.1 kg)   LMP 08/26/2017   BMI 39.78 kg/m   Physical Exam NAD Obese HEENT:  PERRL, EOMI, Discs sharp,  TMs pearly gray, throat without  injection.   Neck:  Supple, No adenopathy, no thyromegaly Chest:  CTA CV:  RRR with normal S1 and S2, No S3, S4 or murmur.  No carotid bruits.  Carotid, radial and DP pulses normal and equal Abd:  S, NT, No HSM or mass, + BS LE:  No edema Neuro:  A & O x 3, CN II-XII grossly intact.  Motor 5/5, DTRs 2+/4.  Gait normal.     Assessment & Plan   Migraines:  Increase sumatriptan to 100 mg and use earlier with migraine type HA pain.  Discussed repeating dose if still with migraine in 2 hours.  If does not improve, need to consider prevention.    2.  Elevated BP:  Possibly due to pain.  If still elevated at CPE, consider meds.  CBC, CMP  3.  Likely OSA:  referral for split night sleep study.  Work on lifestyle changes to reduce weight.  4.  Obesity:  as in #3.  Discussed diet and making weekly goals with eating habits and daily physical activity.  TSH, A1C, FLP  5.  HM:  Tdap today.

## 2022-09-20 ENCOUNTER — Telehealth: Payer: Self-pay

## 2022-09-20 LAB — CBC WITH DIFFERENTIAL/PLATELET
Basophils Absolute: 0.1 10*3/uL (ref 0.0–0.2)
Basos: 1 %
EOS (ABSOLUTE): 0.3 10*3/uL (ref 0.0–0.4)
Eos: 3 %
Hematocrit: 45 % (ref 34.0–46.6)
Hemoglobin: 14.4 g/dL (ref 11.1–15.9)
Immature Grans (Abs): 0 10*3/uL (ref 0.0–0.1)
Immature Granulocytes: 0 %
Lymphocytes Absolute: 3.6 10*3/uL — ABNORMAL HIGH (ref 0.7–3.1)
Lymphs: 40 %
MCH: 28.1 pg (ref 26.6–33.0)
MCHC: 32 g/dL (ref 31.5–35.7)
MCV: 88 fL (ref 79–97)
Monocytes Absolute: 0.8 10*3/uL (ref 0.1–0.9)
Monocytes: 9 %
Neutrophils Absolute: 4.4 10*3/uL (ref 1.4–7.0)
Neutrophils: 47 %
Platelets: 295 10*3/uL (ref 150–450)
RBC: 5.12 x10E6/uL (ref 3.77–5.28)
RDW: 14.1 % (ref 11.7–15.4)
WBC: 9.2 10*3/uL (ref 3.4–10.8)

## 2022-09-20 LAB — COMPREHENSIVE METABOLIC PANEL
ALT: 26 IU/L (ref 0–32)
AST: 21 IU/L (ref 0–40)
Albumin/Globulin Ratio: 1.9 (ref 1.2–2.2)
Albumin: 4.4 g/dL (ref 3.9–4.9)
Alkaline Phosphatase: 115 IU/L (ref 44–121)
BUN/Creatinine Ratio: 15 (ref 9–23)
BUN: 11 mg/dL (ref 6–24)
Bilirubin Total: 0.2 mg/dL (ref 0.0–1.2)
CO2: 22 mmol/L (ref 20–29)
Calcium: 9.5 mg/dL (ref 8.7–10.2)
Chloride: 105 mmol/L (ref 96–106)
Creatinine, Ser: 0.72 mg/dL (ref 0.57–1.00)
Globulin, Total: 2.3 g/dL (ref 1.5–4.5)
Glucose: 87 mg/dL (ref 70–99)
Potassium: 4.1 mmol/L (ref 3.5–5.2)
Sodium: 140 mmol/L (ref 134–144)
Total Protein: 6.7 g/dL (ref 6.0–8.5)
eGFR: 103 mL/min/{1.73_m2} (ref >59)

## 2022-09-20 LAB — LIPID PANEL W/O CHOL/HDL RATIO
Cholesterol, Total: 162 mg/dL (ref 100–199)
HDL: 55 mg/dL (ref >39)
LDL Chol Calc (NIH): 86 mg/dL (ref 0–99)
Triglycerides: 121 mg/dL (ref 0–149)
VLDL Cholesterol Cal: 21 mg/dL (ref 5–40)

## 2022-09-20 LAB — HGB A1C W/O EAG: Hgb A1c MFr Bld: 6.1 % — ABNORMAL HIGH (ref 4.8–5.6)

## 2022-09-20 LAB — TSH: TSH: 0.982 u[IU]/mL (ref 0.450–4.500)

## 2022-09-20 NOTE — Telephone Encounter (Signed)
Needs appt for bp recheck

## 2022-09-24 NOTE — Telephone Encounter (Signed)
Patient has been scheduled for bp check

## 2022-09-24 NOTE — Telephone Encounter (Signed)
Could we get her in in next couple of weeks for BP check?

## 2022-10-09 ENCOUNTER — Ambulatory Visit (INDEPENDENT_AMBULATORY_CARE_PROVIDER_SITE_OTHER): Payer: BLUE CROSS/BLUE SHIELD

## 2022-10-09 VITALS — BP 146/110 | HR 64

## 2022-10-09 DIAGNOSIS — I10 Essential (primary) hypertension: Secondary | ICD-10-CM | POA: Diagnosis not present

## 2022-10-10 ENCOUNTER — Telehealth: Payer: Self-pay

## 2022-10-10 DIAGNOSIS — G4730 Sleep apnea, unspecified: Secondary | ICD-10-CM

## 2022-10-10 MED ORDER — LISINOPRIL 10 MG PO TABS: 10.0000 mg | ORAL_TABLET | Freq: Every day | ORAL | 3 refills | Status: DC

## 2022-10-10 NOTE — Progress Notes (Signed)
After reporting bp to Dr Amil Amen, patient has been instructed to start on lisinopril '10mg'$  once daily. Patient has been scheduled to return in two weeks for repeat bp check.

## 2022-10-10 NOTE — Telephone Encounter (Signed)
[facility] is not in network with patients insurance.  Confirmed that [facility] is in network and is accepting new patients

## 2022-10-30 ENCOUNTER — Ambulatory Visit (INDEPENDENT_AMBULATORY_CARE_PROVIDER_SITE_OTHER): Payer: BLUE CROSS/BLUE SHIELD

## 2022-10-30 VITALS — BP 134/84 | HR 76

## 2022-10-30 DIAGNOSIS — Z0130 Encounter for examination of blood pressure without abnormal findings: Secondary | ICD-10-CM

## 2022-10-30 NOTE — Progress Notes (Unsigned)
Patient reported that she is taking her bp medication consistently. Patient has not had any problems while on medication.   After reporting bp to Dr Amil Amen, no changes to medication will be made.

## 2022-12-23 ENCOUNTER — Encounter: Payer: BLUE CROSS/BLUE SHIELD | Admitting: Internal Medicine

## 2023-02-27 ENCOUNTER — Encounter: Payer: BLUE CROSS/BLUE SHIELD | Admitting: Internal Medicine

## 2023-05-09 ENCOUNTER — Encounter: Payer: Self-pay | Admitting: Internal Medicine

## 2023-06-20 NOTE — Telephone Encounter (Signed)
Referral sent 

## 2023-07-17 ENCOUNTER — Encounter: Payer: BLUE CROSS/BLUE SHIELD | Admitting: Internal Medicine

## 2023-08-04 NOTE — Telephone Encounter (Signed)
 [facility] Is no longer in network with patient insurance. [facility] is accepting all insurance and they would do to PA to see if patient insurance would cover visit

## 2023-09-10 NOTE — Telephone Encounter (Signed)
[facility] [place] fax number is [phone]

## 2023-09-10 NOTE — Telephone Encounter (Signed)
Sending referral to [facility].

## 2023-09-11 NOTE — Addendum Note (Signed)
Addended by: Marcene Duos on: 09/11/2023 09:53 PM   Modules accepted: Level of Service

## 2023-12-17 ENCOUNTER — Encounter (HOSPITAL_BASED_OUTPATIENT_CLINIC_OR_DEPARTMENT_OTHER): Admitting: Internal Medicine

## 2023-12-23 ENCOUNTER — Telehealth: Payer: Self-pay

## 2023-12-23 ENCOUNTER — Encounter: Payer: BLUE CROSS/BLUE SHIELD | Admitting: Internal Medicine

## 2023-12-23 NOTE — Telephone Encounter (Signed)
 Patient's CPE was rescheduled , patient would like to be on wait list for a sooner appointment.   Patient is available at 8:30am.  We will call patient when there is a cancellation.

## 2024-02-18 NOTE — Telephone Encounter (Signed)
Called patient to offer appointment, patient did not answer.

## 2024-07-06 ENCOUNTER — Encounter: Payer: Self-pay | Admitting: Internal Medicine

## 2024-07-06 ENCOUNTER — Ambulatory Visit (INDEPENDENT_AMBULATORY_CARE_PROVIDER_SITE_OTHER): Admitting: Internal Medicine

## 2024-07-06 VITALS — BP 138/90 | HR 66 | Resp 19 | Ht 61.5 in | Wt 217.0 lb

## 2024-07-06 DIAGNOSIS — E66812 Obesity, class 2: Secondary | ICD-10-CM | POA: Diagnosis not present

## 2024-07-06 DIAGNOSIS — Z0000 Encounter for general adult medical examination without abnormal findings: Secondary | ICD-10-CM | POA: Diagnosis not present

## 2024-07-06 DIAGNOSIS — I10 Essential (primary) hypertension: Secondary | ICD-10-CM | POA: Diagnosis not present

## 2024-07-06 DIAGNOSIS — Z1231 Encounter for screening mammogram for malignant neoplasm of breast: Secondary | ICD-10-CM | POA: Diagnosis not present

## 2024-07-06 DIAGNOSIS — Z6839 Body mass index (BMI) 39.0-39.9, adult: Secondary | ICD-10-CM

## 2024-07-06 DIAGNOSIS — Z23 Encounter for immunization: Secondary | ICD-10-CM | POA: Diagnosis not present

## 2024-07-06 DIAGNOSIS — R7303 Prediabetes: Secondary | ICD-10-CM | POA: Diagnosis not present

## 2024-07-06 DIAGNOSIS — E6609 Other obesity due to excess calories: Secondary | ICD-10-CM | POA: Diagnosis not present

## 2024-07-06 DIAGNOSIS — Z1211 Encounter for screening for malignant neoplasm of colon: Secondary | ICD-10-CM | POA: Diagnosis not present

## 2024-07-06 MED ORDER — LISINOPRIL 10 MG PO TABS: 10.0000 mg | ORAL_TABLET | Freq: Every day | ORAL | 3 refills | Status: AC

## 2024-07-06 NOTE — Progress Notes (Signed)
 Subjective:    Patient ID: Kelsey Gaines, female   DOB: 09-14-1975, 48 y.o.   MRN: [id number]   HPI  Kelsey Gaines  Has not been seen for chronic health issues since 08/2022.    CPE without pap  1.  Pap:  History of TAH/BSO for benign reasons.  Sound like had pap in past year with normal pelvic exam in [place] when back home.  Cannot say what month.    2.  Mammogram:  Performed in [place].  Last was 2 years ago.  Normal.   No family history of breast cancer.  She does not have voicemail set up for scheduling, but can have her teenaged child help her do so.    3.  Osteoprevention:  Drinks milk once daily.  She is willing to increase 3 times daily.  She is not outside frequently.   Exercises inside regularly, sometime walks outside.    4.  Guaiac Cards/FIT:  Never here.  5.  Colonoscopy:  History of EGD and colonoscopy when had hemorrhoidal surgery about 10 years ago (2015).  Performed  in [place].  She was not told when to have performed again.    6.  Immunizations:  Has not had influenza, covid, Pneumococcal 20 or Shingles vaccination.  She is afraid of vaccines.   Immunization History  Administered Date(s) Administered   Tdap 09/19/2022     7.  Glucose/Cholesterol:  History of prediabetes with A1C of 6.1% in 08/2022.  Cholesterol from same date fine.   Lipid Panel     Component Value Date/Time   CHOL 162 09/19/2022 1300   TRIG 121 09/19/2022 1300   HDL 55 09/19/2022 1300   LDLCALC 86 09/19/2022 1300   LABVLDL 21 09/19/2022 1300   8.  Hypertension:  BP improved to normal range after starting Lisinopril  10 mg daily in 2024, but she stopped taking after 3 months.      No outpatient medications have been marked as taking for the 07/06/24 encounter (Office Visit) with Adella Norris, MD.   Allergies  Allergen Reactions   Ciprofloxacin Hives   Diclofenac    Tylenol [Acetaminophen] Hives   Past Medical History:  Diagnosis Date   Colitis    Not sure if this is correct or  just problem with hemorrhoids   Hemorrhoids    Hypertension 2024   Prediabetes 08/2022   Past Surgical History:  Procedure Laterality Date   APPENDECTOMY     CESAREAN SECTION     HEMORRHOID SURGERY     2 surgeries in past   TOTAL ABDOMINAL HYSTERECTOMY W/ BILATERAL SALPINGOOPHORECTOMY     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  Cannot recall how long ago done in [place].   Family History  Problem Relation Age of Onset   Hypertension Mother    Diabetes Mother    Pneumonia Mother        cause of death   Migraines Father    Cirrhosis Brother        cause of death   Alcohol abuse Brother    Social History   Socioeconomic History   Marital status: Media Planner    Spouse name: Sheree Lot   Number of children: 2   Years of education: Not on file   Highest education level: Some college, no degree  Occupational History   Occupation: homemaker  Tobacco Use   Smoking status: Never    Passive exposure: Never   Smokeless tobacco: Never  Vaping Use  Vaping status: Never Used  Substance and Sexual Activity   Alcohol use: No   Drug use: No   Sexual activity: Yes    Birth control/protection: None, Surgical  Other Topics Concern   Not on file  Social History Narrative   Originally from the [place].   Left college after 3 semesters and cared for her mother until moving [place] the [place]  in 04[place]2017.   She lives with her boyfriend and her older daughter.   Her younger daughter joined them 08/2021   Social Drivers of Health   Financial Resource Strain: Low Risk  (07/06/2024)   Overall Financial Resource Strain (CARDIA)    Difficulty of Paying Living Expenses: Not hard at all  Food Insecurity: No Food Insecurity (07/06/2024)   Hunger Vital Sign    Worried About Running Out of Food in the Last Year: Never true    Ran Out of Food in the Last Year: Never true  Transportation Needs: No Transportation Needs (07/06/2024)   PRAPARE - Administrator, Civil Service  (Medical): No    Lack of Transportation (Non-Medical): No  Physical Activity: Not on file  Stress: Not on file  Social Connections: Not on file  Intimate Partner Violence: Not At Risk (07/06/2024)   Humiliation, Afraid, Rape, and Kick questionnaire    Fear of Current or Ex-Partner: No    Emotionally Abused: No    Physically Abused: No    Sexually Abused: No     Review of Systems  Respiratory:  Negative for shortness of breath.   Cardiovascular:  Negative for chest pain and leg swelling.  Musculoskeletal:        Some discomfort of shins.      Objective:   BP (!) 138/90 (BP Location: Right Arm, Patient Position: Sitting, Cuff Size: Normal)   Pulse 66   Resp 19   Ht 5' 1.5 (1.562 m)   Wt 217 lb (98.4 kg)   LMP 08/26/2017   BMI 40.34 kg/m   Physical Exam Constitutional:      Appearance: She is obese.  HENT:     Head: Normocephalic and atraumatic.     Right Ear: Tympanic membrane, ear canal and external ear normal.     Left Ear: Tympanic membrane, ear canal and external ear normal.     Nose: Nose normal.     Mouth/Throat:     Mouth: Mucous membranes are moist.     Pharynx: Oropharynx is clear.  Eyes:     Extraocular Movements: Extraocular movements intact.     Conjunctiva/sclera: Conjunctivae normal.     Pupils: Pupils are equal, round, and reactive to light.     Comments: Discs sharp  Neck:     Thyroid: No thyroid mass or thyromegaly.  Cardiovascular:     Rate and Rhythm: Normal rate and regular rhythm.     Heart sounds: S1 normal and S2 normal. No murmur heard.    No friction rub. No S3 or S4 sounds.     Comments: No carotid bruits.  Carotid, radial, femoral, DP and PT pulses normal and equal.   Pulmonary:     Effort: Pulmonary effort is normal.     Breath sounds: Normal breath sounds and air entry.  Chest:  Breasts:    Right: No inverted nipple, mass or nipple discharge.     Left: No inverted nipple, mass or nipple discharge.  Abdominal:     General:  Bowel sounds are normal.     Palpations:  Abdomen is soft. There is no hepatomegaly, splenomegaly or mass.     Tenderness: There is no abdominal tenderness.     Hernia: No hernia is present.  Genitourinary:    Comments: Deferred as TAH/BSO for benign reasons Musculoskeletal:        General: Normal range of motion.     Cervical back: Normal range of motion.     Right lower leg: No edema.     Left lower leg: No edema.  Lymphadenopathy:     Head:     Right side of head: No submental or submandibular adenopathy.     Left side of head: No submental or submandibular adenopathy.     Cervical: No cervical adenopathy.     Upper Body:     Right upper body: No supraclavicular or axillary adenopathy.     Left upper body: No supraclavicular or axillary adenopathy.     Lower Body: No right inguinal adenopathy. No left inguinal adenopathy.  Skin:    General: Skin is warm.     Capillary Refill: Capillary refill takes less than 2 seconds.     Findings: No lesion or rash.  Neurological:     General: No focal deficit present.     Mental Status: She is alert and oriented to person, place, and time.     Cranial Nerves: Cranial nerves 2-12 are intact.     Sensory: Sensation is intact.     Motor: Motor function is intact.     Coordination: Coordination is intact.     Gait: Gait is intact.     Deep Tendon Reflexes: Reflexes are normal and symmetric.  Psychiatric:        Mood and Affect: Mood normal.        Speech: Speech normal.        Behavior: Behavior normal. Behavior is cooperative.     Comments: Tearful at loss of mother last year.      Assessment & Plan   CPE without pap Pap done in [place] and no uterus or ovaries, removed for benign reasons. Referral for screening colonoscopy CBC  2.  Hypertension:  discussed treatment vs cure and need to stay on Lisinopril .  Refilled.  Encouraged working on diet and physical activity.  CMP  3.  Prediabetes/obesity:  Will see if she can get transport for  next [facility] program.  A1C

## 2024-07-06 NOTE — Patient Instructions (Signed)
 Tome un vaso de agua antes de cada comida Tome un minimo de 6 a 8 vasos de agua diarios Coma tres veces al dia Coma una proteina y Neomia Dear grasa saludable con comida.  (huevos, pescado, pollo, pavo, y limite carnes rojas Coma 5 porciones diarias de legumbres.  Mezcle los colores Coma 2 porciones diarias de frutas con cascara cuando sea comestible Use platos pequeos Suelte su tenedor o cuchara despues de cada mordida hata que se mastique y se trague Come en la mesa con amigos o familiares por lo menos una vez al dia [place]ague la televisin y aparatos electrnicos durante la comida  Su objetivo debe ser perder una libra por semana  Estudios recientes indican que las personas quienes consumen todos de sus calorias durante 12 horas se bajan de pesocon Mas eficiencia.  Por ejemplo, si Usted come su primera comida a las 7:00 a.m., su comida final del dia se debe completar antes de las 7:00 p.m.

## 2024-07-13 ENCOUNTER — Telehealth: Payer: Self-pay | Admitting: Internal Medicine

## 2024-07-13 NOTE — Telephone Encounter (Signed)
 Called patient to schedule her follow up appointment from previous appointment.   Patient states she has been feeling bad. Patient reports a week ago after starting bp medication on 07/07/24 , patient reports having stomachache that makes her rush to the bathroom, headache and nausea . Patient states she only vomited this past Sunday.    Patient states she had taken a bp medication before but did not had this side effect , patient believes this might be a different medication.

## 2024-07-14 NOTE — Telephone Encounter (Signed)
 Call her and ask her bring her medication to be checked ,

## 2024-07-15 NOTE — Telephone Encounter (Signed)
 Patient has been seen.

## 2024-07-15 NOTE — Telephone Encounter (Signed)
 Patient has been scheduled for tomorrow at 11:30 am and has been asked to bring in all medications,

## 2024-07-16 ENCOUNTER — Other Ambulatory Visit

## 2024-07-16 NOTE — Progress Notes (Signed)
 Confirmed that patient is getting lisinopril  10 mg tablets. Patient reports that stomach pain one hour after taking lisinopril  every time. Also gets nauseous and vomited this past Sunday.  She takes medication around one after lunch,. These side effects did not happen when she was taking this medication in the past.   Confirmed that the pills patient received is Lisinopril  10mg .  She needs to be seen by Dr.Mulberry for her stomach pain

## 2024-08-04 ENCOUNTER — Encounter: Payer: Self-pay | Admitting: Internal Medicine

## 2024-08-04 ENCOUNTER — Ambulatory Visit: Admitting: Internal Medicine

## 2024-08-04 VITALS — BP 130/90 | HR 81 | Resp 18 | Ht 61.5 in | Wt 215.0 lb

## 2024-08-04 DIAGNOSIS — G4700 Insomnia, unspecified: Secondary | ICD-10-CM

## 2024-08-04 DIAGNOSIS — T887XXA Unspecified adverse effect of drug or medicament, initial encounter: Secondary | ICD-10-CM

## 2024-08-04 DIAGNOSIS — F439 Reaction to severe stress, unspecified: Secondary | ICD-10-CM

## 2024-08-04 DIAGNOSIS — R7303 Prediabetes: Secondary | ICD-10-CM

## 2024-08-04 DIAGNOSIS — I10 Essential (primary) hypertension: Secondary | ICD-10-CM

## 2024-08-04 DIAGNOSIS — G4730 Sleep apnea, unspecified: Secondary | ICD-10-CM

## 2024-08-04 NOTE — Progress Notes (Unsigned)
 "  Established Patient Office Visit [name] interprets History from patient and EPIC notes, discussion with Dr. Adella I, MD in training, with pt permission, did H/P under direct supervision by Dr. Adella; she was present in the room during the exam. She did the final A/P, orders and observed me as I did discharged patient. Pt expressed understanding.   Subjective   Patient ID: Kelsey Gaines, female    DOB: 03/29/1976  Age: 49 y.o. MRN: [id number]  Was prescribed linsopril 10 mg po for HTN on 07/06/24, 1 month ago. The first time she took it, she got abodmenal pain in epigastric and LUQ/LLQ areas. She said she also got the flu vaccine that day and not sure if it was the vaccine or the med;(it was actually the pneumococcal vaccine, per Dr. Adella). The fist day after taking the med and the vaccine she also had nausea and vomiting.  After that she continued taking Lisinopril  again and she got the same abdomenal pain. She cut it in 1/2 and still has the belly pain with some nausea.  Got the pain 1 hour after taking Lisinopril  and it would last all day. So she stopped it on 07/16/24.  She has taken this med before but does not recall any problems. Any problems with any other meds?  I do not know.  [place] she was told that she was allergic to most medications, the allergic reaction being skin rash but no belly pain. Has not had any symptoms since stopping  She does not know if there are any anti-hypertensive meds that work for her.  She does not take any meds. Says she has not had high BP for awhile. Review in EPIC reveals BP's 140's/ up to 120 in clinic.    Exercise: walks and bikes - 30 minutes a day. Weight management: My doctor told me to do certain things: more fruits , more milk, so I have been doing this. She feels lighter.   Review of records show stable BMI around 40. Salt intake: Cooks her own food and eats low salt. No restaruant or buying of prepared meals. Sleep:  Cannot sleep.  I sleep but my mind is open. Snores. Before she would wake up chocking and could not breath out through the nose. Husband used to say she would wake up chocking. But not for the past 3 months. She said that she had chocking spells for a short while. Drink 1/2 cup of coffee at 1pm.  No soda.    Stress level? There is always some thing. My father is old but mom recently passed out. He has gone back to sister's house; wants to go back to his country. Pt tearful at this time. -Finds it difficult to lose weight   Chief Complaint  Patient presents with   Abdominal Pain    After using blood pressure lisinopril  10    Patient Active Problem List   Diagnosis Date Noted   Primary hypertension 07/06/2024   Hemorrhoids 09/19/2022   Colitis 09/19/2022   Class 2 obesity due to excess calories with body mass index (BMI) of 39.0 to 39.9 in adult 09/19/2022   Sleep apnea 09/19/2022   Migraine without status migrainosus, not intractable 09/19/2022   Prediabetes 08/2022    Past Surgical History:  Procedure Laterality Date   APPENDECTOMY     CESAREAN SECTION     HEMORRHOID SURGERY     2 surgeries in past   TOTAL ABDOMINAL HYSTERECTOMY W/ BILATERAL SALPINGOOPHORECTOMY  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  Cannot recall how long ago done in [place].    Allergies[1] Active Medications[2]  Review of Systems  Respiratory:  Negative for shortness of breath.   Cardiovascular:  Negative for chest pain.  Neurological:  Negative for headaches.      Objective:     LMP 08/26/2017  Vitals:   08/04/24 1000 08/04/24 1011  BP: (!) 130/90 (!) 130/90  Pulse: 81   Resp: 18    Wt 215  BMI 39.97  Physical Exam Vitals reviewed.  Constitutional:      Appearance: She is well-developed. She is obese.  Eyes:     Pupils: Pupils are equal, round, and reactive to light.  Cardiovascular:     Rate and Rhythm: Normal rate and regular rhythm.     Comments: No carotid bruits DP and  radial pulses intact B Pulmonary:     Effort: Pulmonary effort is normal. No respiratory distress.     Breath sounds: No stridor. No wheezing.     Comments: No pitting edema of the legs. Abdominal:     General: There is no distension.     Palpations: Abdomen is soft.     Comments: Nontender  Neurological:     Mental Status: She is alert.  Psychiatric:     Comments: Tearful at times      Assessment & Plan:  1- Stress- Loss of mother and her father now is a problem -Social work referral  2- Insomnia: Poor quality sleep- Most likely due to stress. R/O sleep apnea.  -Stress management via social work referral -Reassess in 3 months to see if there is improvement.  Pt's story is unclear for sleep apnea. If there is a question in 3 months, get a more detailed history and refer for sleep study -During weight loss clinic- educate pt about importance of good sleep for BP and wt control and further assess if caffeine an issue.  3-Primary HTN-  May have secondary components of stress and ?sleep apnea ? -It is around 130/90 off meds now. - stress management as above - sleep management - weight loss clinic - pt advised that weight loss is highly recommended specially that she also has prediabetes.  Pt agreed to come to Friday weight loss clinic and every 2 weeks thereafter;  transportation issue solved : she is to bundle up and come on her motor cycle or take the bus if husband cannot drop her off.  -Hold off on adding another anti-hypertensive to replace lisinopril . If no improvement at 3 months or if worse, will start another BP med.  4-Pre-Diabetes  5- Adverse Drug Reaction to Lisinopril - Abdomenal Pain /Nausea/Vomiting- Completely better now.  -Agree with pt stopping Lisinopril .  6- Vaccines due but none available due to vaccine refrigerator breakdown.           [1]  Allergies Allergen Reactions   Ciprofloxacin Hives   Diclofenac    Tylenol [Acetaminophen] Hives  [2]  No  outpatient medications have been marked as taking for the 08/04/24 encounter (Office Visit) with Adella Norris, MD.   "

## 2024-08-04 NOTE — Progress Notes (Unsigned)
 [name] interprets Patient seen with Dr. Fleta

## 2024-08-13 ENCOUNTER — Ambulatory Visit: Admitting: Internal Medicine

## 2024-08-13 ENCOUNTER — Other Ambulatory Visit (INDEPENDENT_AMBULATORY_CARE_PROVIDER_SITE_OTHER)

## 2024-08-13 VITALS — BP 130/90 | HR 66

## 2024-08-13 DIAGNOSIS — I10 Essential (primary) hypertension: Secondary | ICD-10-CM | POA: Diagnosis not present

## 2024-08-13 DIAGNOSIS — Z0130 Encounter for examination of blood pressure without abnormal findings: Secondary | ICD-10-CM | POA: Diagnosis not present

## 2024-08-13 DIAGNOSIS — Z23 Encounter for immunization: Secondary | ICD-10-CM | POA: Diagnosis not present

## 2024-08-13 NOTE — Progress Notes (Signed)
 Patient discontinued Lisinopril  due to what she felt was side effect of abdominal pain She is working with Dr. Fleta on dietary changes and weight loss. Has an appt with her in 1 week.   Holding Lisinopril  for now.  BP and weight check in 2 months.

## 2024-08-13 NOTE — Progress Notes (Unsigned)
 "  Established Patient Office Visit  Subjective   Patient ID: Kelsey Gaines, female    DOB: 03-07-1976  Age: 49 y.o. MRN: [id number] Pt seen by me at Dr. Felix request for more intense nutrition counseling.   [name] interpreted. No charge. Note started on day of visit and completed 08/17/24  Chief Complaint  Patient presents with   Nutrition Counseling    Pt filled out the Dietary Diary.   Has already made changes to her diet based on her own knowledge; has increased the water to 4 16 oz bottles a day and reduced the rice to 2 tablespoons a day.  Eats mostly ligthtly cooked veggies, 1 portion of chicken breast the size of a playing card, pours 1 teaspoon of olive oil on the meat,  minimal bread, no fresh veggies, no pasta, occasional milk with cereal, occasional egg. Is doing a very low calorie diet, maybe around 800/day.  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  Cannot               recall how long ago done in DR.  No outpatient medications have been marked as taking for the 08/13/24 encounter (Office Visit) with Adella Norris, MD.    -- Ciprofloxacin -- Hives  -- Lisinopril  -- Other (See Comments)   --  Abdomenal pain, Nausea, vomiting  -- Diclofenac   -- Tylenol [Acetaminophen] -- Hives   No outpatient medications have been marked as taking for the 08/13/24 encounter (Office Visit) with Adella Norris, MD.   SoHx- Lives her husband. 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  Transportation a problem; has a motorcycle which she uses  when not so cold. Husband brings her today. Time limited.     {History ([employer]78} Objective:    Weight 215-217 BMI stabilized at 40 Physical Exam Constitutional:      Appearance: Normal appearance.     Comments: Very High BMI  Neurological:     Mental Status: She is alert.     Gait: Gait normal.  Psychiatric:        Mood and Affect: Mood normal.        Behavior: Behavior normal.        Thought Content: Thought content normal.        Judgment: Judgment normal.     Assessment & Plan:  1- High BMI 2- Pre-diabetes  Pt already made some good changes since last visit on her own.  Has put herself on a very low calorie diet.   Estimated around 800/day but low in certain nutrition.  - Some education given about protein, calcium, good oils and why they are needed. - Advised to take calcium and vit D daily and how much - Do some of her veggies fresh: 2 carrots a day added for vit C and A to make it simple for her. The rest she can lightly steam as she is doing. - Add 2 tablespoon of uncooked olive oil or canola oil and 1/2  cup of nuts a day for fats. - Continue with low amounts of rice and no pasta - Increase protein to 2 portions a day.  - Pt wrote this down. - Given another blank weekly Diary so she can record. - Continue the rest of her diet the same if she can for now;  If it is hard to maintain, add more veggies and proteins and fats and some measured amounts of carbs; 1200-1400 calories per day might be easier for her to maintain  -RTC in 1 week.  -Asked Erminio to ask her to bring calcium and vit D tablets with her next time  when she inofrms her of her appt so I can check dose.  Will see if if she needs a multi after a few visits -Get weight in 1 month.    Caron Kaiser, MD  "

## 2024-08-13 NOTE — Progress Notes (Signed)
 Bp today 140/90 pulse  is 66 Second check 130/90 She has no medication. She just works on diet  She did not want to get vaccine

## 2024-08-14 LAB — BASIC METABOLIC PANEL WITH GFR
Calcium: 9.6 mg/dL (ref 8.7–10.2)
Chloride: 107 mmol/L — ABNORMAL HIGH (ref 96–106)
Creatinine, Ser: 0.77 mg/dL (ref 0.57–1.00)
Glucose: 72 mg/dL (ref 70–99)
Sodium: 142 mmol/L (ref 134–144)
eGFR: 94 mL/min/1.73

## 2024-08-20 NOTE — Progress Notes (Signed)
 Patient has been scheduled to return in 2 months for bp check and weight check.

## 2024-08-23 ENCOUNTER — Other Ambulatory Visit: Admitting: Psychology

## 2024-08-25 ENCOUNTER — Other Ambulatory Visit: Admitting: Psychology

## 2024-10-18 ENCOUNTER — Encounter

## 2025-01-06 ENCOUNTER — Ambulatory Visit: Admitting: Internal Medicine

## 2025-07-07 ENCOUNTER — Other Ambulatory Visit: Admitting: Internal Medicine

## 2025-07-12 ENCOUNTER — Encounter: Admitting: Internal Medicine
